# Patient Record
Sex: Female | Born: 1995 | Race: White | Hispanic: No | Marital: Single | Smoking: Never smoker
Health system: Southern US, Community
[De-identification: ages and names within clinical notes are randomized; demographics above are authoritative.]

## PROBLEM LIST (undated history)

## (undated) DIAGNOSIS — R519 Headache, unspecified: Secondary | ICD-10-CM

## (undated) DIAGNOSIS — R51 Headache: Secondary | ICD-10-CM

## (undated) DIAGNOSIS — J189 Pneumonia, unspecified organism: Secondary | ICD-10-CM

## (undated) HISTORY — PX: ANTERIOR CRUCIATE LIGAMENT REPAIR: SHX115

## (undated) HISTORY — PX: LINGUAL FRENECTOMY: SHX6357

---
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
## 2015-05-02 ENCOUNTER — Other Ambulatory Visit (HOSPITAL_COMMUNITY): Payer: Self-pay | Admitting: Orthopedic Surgery

## 2015-06-05 ENCOUNTER — Encounter (HOSPITAL_COMMUNITY): Payer: Self-pay | Admitting: *Deleted

## 2015-06-05 NOTE — Progress Notes (Signed)
Pt denies any cardiac history.

## 2015-06-06 ENCOUNTER — Encounter (HOSPITAL_COMMUNITY)
Admission: RE | Disposition: A | Discharge status: Home / Self Care | Payer: Self-pay | Source: Ambulatory Visit | Attending: Orthopedic Surgery

## 2015-06-06 ENCOUNTER — Ambulatory Visit (HOSPITAL_COMMUNITY): Payer: 59 | Admitting: Anesthesiology

## 2015-06-06 ENCOUNTER — Ambulatory Visit (HOSPITAL_COMMUNITY): Payer: 59

## 2015-06-06 ENCOUNTER — Ambulatory Visit (HOSPITAL_COMMUNITY)
Admission: RE | Admit: 2015-06-06 | Discharge: 2015-06-06 | Disposition: A | Discharge status: Home / Self Care | Payer: 59 | Source: Ambulatory Visit | Attending: Orthopedic Surgery | Admitting: Orthopedic Surgery

## 2015-06-06 ENCOUNTER — Encounter (HOSPITAL_COMMUNITY): Payer: Self-pay | Admitting: Anesthesiology

## 2015-06-06 DIAGNOSIS — Z419 Encounter for procedure for purposes other than remedying health state, unspecified: Secondary | ICD-10-CM

## 2015-06-06 DIAGNOSIS — M94261 Chondromalacia, right knee: Secondary | ICD-10-CM | POA: Insufficient documentation

## 2015-06-06 DIAGNOSIS — Z793 Long term (current) use of hormonal contraceptives: Secondary | ICD-10-CM | POA: Insufficient documentation

## 2015-06-06 DIAGNOSIS — M238X1 Other internal derangements of right knee: Secondary | ICD-10-CM | POA: Insufficient documentation

## 2015-06-06 HISTORY — DX: Headache, unspecified: R51.9

## 2015-06-06 HISTORY — PX: ANTERIOR CRUCIATE LIGAMENT REPAIR: SHX115

## 2015-06-06 HISTORY — DX: Headache: R51

## 2015-06-06 HISTORY — DX: Pneumonia, unspecified organism: J18.9

## 2015-06-06 LAB — PREGNANCY, URINE: PREG TEST UR: NEGATIVE

## 2015-06-06 SURGERY — RECONSTRUCTION, KNEE, ACL, USING HAMSTRING GRAFT
Anesthesia: Regional | Site: Knee | Laterality: Right

## 2015-06-06 MED ORDER — GLYCOPYRROLATE 0.2 MG/ML IJ SOLN
INTRAMUSCULAR | Status: DC | PRN
  Administered 2015-06-06: 0.4 mg via INTRAVENOUS

## 2015-06-06 MED ORDER — HYDROMORPHONE HCL 1 MG/ML IJ SOLN
INTRAMUSCULAR | Status: AC
  Filled 2015-06-06: qty 1

## 2015-06-06 MED ORDER — OXYCODONE-ACETAMINOPHEN 5-325 MG PO TABS: 1.0000 | ORAL_TABLET | Freq: Four times a day (QID) | ORAL | Status: AC | PRN

## 2015-06-06 MED ORDER — MIDAZOLAM HCL 5 MG/5ML IJ SOLN
INTRAMUSCULAR | Status: DC | PRN
  Administered 2015-06-06: 2 mg via INTRAVENOUS

## 2015-06-06 MED ORDER — CHLORHEXIDINE GLUCONATE 4 % EX LIQD: 60.0000 mL | Freq: Once | CUTANEOUS | Status: DC

## 2015-06-06 MED ORDER — METHOCARBAMOL 500 MG PO TABS: 500.0000 mg | ORAL_TABLET | Freq: Four times a day (QID) | ORAL | Status: AC

## 2015-06-06 MED ORDER — DEXAMETHASONE SODIUM PHOSPHATE 4 MG/ML IJ SOLN
INTRAMUSCULAR | Status: DC | PRN
  Administered 2015-06-06: 8 mg via INTRAVENOUS

## 2015-06-06 MED ORDER — METHOCARBAMOL 1000 MG/10ML IJ SOLN
500.0000 mg | Freq: Three times a day (TID) | INTRAMUSCULAR | Status: AC
  Administered 2015-06-06: 500 mg via INTRAVENOUS

## 2015-06-06 MED ORDER — FENTANYL CITRATE (PF) 250 MCG/5ML IJ SOLN
INTRAMUSCULAR | Status: AC
  Filled 2015-06-06: qty 5

## 2015-06-06 MED ORDER — LIDOCAINE HCL (CARDIAC) 20 MG/ML IV SOLN
INTRAVENOUS | Status: DC | PRN
  Administered 2015-06-06: 100 mg via INTRATRACHEAL
  Administered 2015-06-06: 40 mg via INTRAVENOUS

## 2015-06-06 MED ORDER — ASPIRIN EC 325 MG PO TBEC: 325.0000 mg | DELAYED_RELEASE_TABLET | Freq: Every day | ORAL | Status: AC

## 2015-06-06 MED ORDER — PROMETHAZINE HCL 25 MG/ML IJ SOLN
6.2500 mg | INTRAMUSCULAR | Status: DC | PRN
  Administered 2015-06-06: 6.25 mg via INTRAVENOUS

## 2015-06-06 MED ORDER — MORPHINE SULFATE (PF) 4 MG/ML IV SOLN
INTRAVENOUS | Status: DC | PRN
Start: 2015-06-06 — End: 2015-06-06
  Administered 2015-06-06: 4 mg via INTRAMUSCULAR

## 2015-06-06 MED ORDER — OXYCODONE-ACETAMINOPHEN 5-325 MG PO TABS
ORAL_TABLET | ORAL | Status: AC
  Filled 2015-06-06: qty 1

## 2015-06-06 MED ORDER — 0.9 % SODIUM CHLORIDE (POUR BTL) OPTIME
TOPICAL | Status: DC | PRN
  Administered 2015-06-06: 1000 mL

## 2015-06-06 MED ORDER — ROCURONIUM BROMIDE 100 MG/10ML IV SOLN
INTRAVENOUS | Status: DC | PRN
  Administered 2015-06-06: 50 mg via INTRAVENOUS

## 2015-06-06 MED ORDER — SODIUM CHLORIDE 0.9 % IR SOLN
Status: DC | PRN
  Administered 2015-06-06 (×2): 3000 mL

## 2015-06-06 MED ORDER — MIDAZOLAM HCL 2 MG/2ML IJ SOLN
INTRAMUSCULAR | Status: AC
  Filled 2015-06-06: qty 2

## 2015-06-06 MED ORDER — DEXAMETHASONE SODIUM PHOSPHATE 4 MG/ML IJ SOLN
INTRAMUSCULAR | Status: AC
  Filled 2015-06-06: qty 2

## 2015-06-06 MED ORDER — FENTANYL CITRATE (PF) 100 MCG/2ML IJ SOLN
100.0000 ug | Freq: Once | INTRAMUSCULAR | Status: AC
  Administered 2015-06-06: 100 ug via INTRAVENOUS

## 2015-06-06 MED ORDER — BUPIVACAINE-EPINEPHRINE (PF) 0.5% -1:200000 IJ SOLN
INTRAMUSCULAR | Status: DC | PRN
  Administered 2015-06-06: 30 mL via PERINEURAL

## 2015-06-06 MED ORDER — BUPIVACAINE HCL (PF) 0.25 % IJ SOLN
INTRAMUSCULAR | Status: AC
  Filled 2015-06-06: qty 30

## 2015-06-06 MED ORDER — FENTANYL CITRATE (PF) 100 MCG/2ML IJ SOLN
INTRAMUSCULAR | Status: DC | PRN
  Administered 2015-06-06 (×2): 50 ug via INTRAVENOUS
  Administered 2015-06-06: 150 ug via INTRAVENOUS

## 2015-06-06 MED ORDER — BUPIVACAINE HCL (PF) 0.25 % IJ SOLN
INTRAMUSCULAR | Status: DC | PRN
  Administered 2015-06-06: 10 mL

## 2015-06-06 MED ORDER — LIDOCAINE HCL (CARDIAC) 20 MG/ML IV SOLN
INTRAVENOUS | Status: AC
  Filled 2015-06-06: qty 15

## 2015-06-06 MED ORDER — PROPOFOL 10 MG/ML IV BOLUS
INTRAVENOUS | Status: DC | PRN
  Administered 2015-06-06: 140 mg via INTRAVENOUS

## 2015-06-06 MED ORDER — FENTANYL CITRATE (PF) 100 MCG/2ML IJ SOLN
INTRAMUSCULAR | Status: AC
  Administered 2015-06-06: 100 ug via INTRAVENOUS
  Filled 2015-06-06: qty 2

## 2015-06-06 MED ORDER — PROPOFOL 10 MG/ML IV BOLUS
INTRAVENOUS | Status: AC
  Filled 2015-06-06: qty 20

## 2015-06-06 MED ORDER — PROMETHAZINE HCL 25 MG/ML IJ SOLN
INTRAMUSCULAR | Status: AC
  Filled 2015-06-06: qty 1

## 2015-06-06 MED ORDER — METHOCARBAMOL 500 MG PO TABS
ORAL_TABLET | ORAL | Status: AC
  Administered 2015-06-06: 500 mg
  Filled 2015-06-06: qty 1

## 2015-06-06 MED ORDER — MIDAZOLAM HCL 2 MG/2ML IJ SOLN
INTRAMUSCULAR | Status: AC
  Administered 2015-06-06: 2 mg
  Filled 2015-06-06: qty 2

## 2015-06-06 MED ORDER — ONDANSETRON HCL 4 MG/2ML IJ SOLN
INTRAMUSCULAR | Status: DC | PRN
  Administered 2015-06-06: 4 mg via INTRAVENOUS

## 2015-06-06 MED ORDER — MORPHINE SULFATE (PF) 4 MG/ML IV SOLN
INTRAVENOUS | Status: AC
  Filled 2015-06-06: qty 1

## 2015-06-06 MED ORDER — OXYCODONE-ACETAMINOPHEN 5-325 MG PO TABS
1.0000 | ORAL_TABLET | Freq: Four times a day (QID) | ORAL | Status: AC | PRN
  Administered 2015-06-06: 1 via ORAL

## 2015-06-06 MED ORDER — LACTATED RINGERS IV SOLN
INTRAVENOUS | Status: DC
  Administered 2015-06-06 (×2): via INTRAVENOUS

## 2015-06-06 MED ORDER — CEFAZOLIN SODIUM-DEXTROSE 2-3 GM-% IV SOLR
2.0000 g | INTRAVENOUS | Status: AC
  Administered 2015-06-06: 2 g via INTRAVENOUS
  Filled 2015-06-06: qty 50

## 2015-06-06 MED ORDER — CLONIDINE HCL (ANALGESIA) 100 MCG/ML EP SOLN
EPIDURAL | Status: DC | PRN
  Administered 2015-06-06: .75 mL via INTRA_ARTICULAR

## 2015-06-06 MED ORDER — MIDAZOLAM HCL 2 MG/2ML IJ SOLN: 2.0000 mg | Freq: Once | INTRAMUSCULAR | Status: DC

## 2015-06-06 MED ORDER — HYDROMORPHONE HCL 1 MG/ML IJ SOLN
0.2500 mg | INTRAMUSCULAR | Status: DC | PRN
  Administered 2015-06-06: 0.25 mg via INTRAVENOUS
  Administered 2015-06-06: 0.5 mg via INTRAVENOUS

## 2015-06-06 MED ORDER — NEOSTIGMINE METHYLSULFATE 10 MG/10ML IV SOLN
INTRAVENOUS | Status: DC | PRN
  Administered 2015-06-06: 3 mg via INTRAVENOUS

## 2015-06-06 SURGICAL SUPPLY — 91 items
ANCHOR BUTTON TIGHTROPE ACL RT (Orthopedic Implant) ×3 IMPLANT
ANCHOR BUTTON TIGHTROPE RN 14 (Anchor) ×3 IMPLANT
BANDAGE ELASTIC 6 VELCRO ST LF (GAUZE/BANDAGES/DRESSINGS) ×3 IMPLANT
BANDAGE ESMARK 6X9 LF (GAUZE/BANDAGES/DRESSINGS) IMPLANT
BIT DRILL 1.5 (BIT) ×1
BIT DRILL 1.5MM (BIT) ×1 IMPLANT
BLADE CUDA 5.5 (BLADE) IMPLANT
BLADE CUTTER GATOR 3.5 (BLADE) ×3 IMPLANT
BLADE GREAT WHITE 4.2 (BLADE) IMPLANT
BLADE GREAT WHITE 4.2MM (BLADE)
BLADE SURG 10 STRL SS (BLADE) ×3 IMPLANT
BLADE SURG 15 STRL LF DISP TIS (BLADE) ×2 IMPLANT
BLADE SURG 15 STRL SS (BLADE) ×4
BNDG ELASTIC 6X15 VLCR STRL LF (GAUZE/BANDAGES/DRESSINGS) ×3 IMPLANT
BNDG ESMARK 6X9 LF (GAUZE/BANDAGES/DRESSINGS)
BUR OVAL 6.0 (BURR) ×3 IMPLANT
CLOSURE WOUND 1/2 X4 (GAUZE/BANDAGES/DRESSINGS) ×1
COVER MAYO STAND STRL (DRAPES) ×3 IMPLANT
COVER SURGICAL LIGHT HANDLE (MISCELLANEOUS) ×6 IMPLANT
CUFF TOURNIQUET SINGLE 34IN LL (TOURNIQUET CUFF) ×3 IMPLANT
CUFF TOURNIQUET SINGLE 44IN (TOURNIQUET CUFF) IMPLANT
DECANTER SPIKE VIAL GLASS SM (MISCELLANEOUS) ×6 IMPLANT
DRAPE ARTHROSCOPY W/POUCH 114 (DRAPES) ×3 IMPLANT
DRAPE C-ARM 42X72 X-RAY (DRAPES) ×3 IMPLANT
DRAPE INCISE IOBAN 66X45 STRL (DRAPES) ×3 IMPLANT
DRAPE U-SHAPE 47X51 STRL (DRAPES) ×3 IMPLANT
DRILL BIT 1.5MM (BIT) ×2
DRILL BIT 3.5 (BIT) ×3 IMPLANT
DRILL FLIPCUTTER II 8.0MM (INSTRUMENTS) ×1 IMPLANT
DRILL FLIPCUTTER II 8.5MM (INSTRUMENTS) ×2 IMPLANT
DRSG MEPILEX BORDER 4X4 (GAUZE/BANDAGES/DRESSINGS) ×6 IMPLANT
DRSG PAD ABDOMINAL 8X10 ST (GAUZE/BANDAGES/DRESSINGS) ×3 IMPLANT
DURAPREP 26ML APPLICATOR (WOUND CARE) ×6 IMPLANT
ELECT REM PT RETURN 9FT ADLT (ELECTROSURGICAL) ×3
ELECTRODE REM PT RTRN 9FT ADLT (ELECTROSURGICAL) ×1 IMPLANT
FLIPCUTTER II 8.0MM (INSTRUMENTS) ×3
FLIPCUTTER II 8.5MM (INSTRUMENTS) ×6
GAUZE SPONGE 4X4 12PLY STRL (GAUZE/BANDAGES/DRESSINGS) ×6 IMPLANT
GAUZE XEROFORM 1X8 LF (GAUZE/BANDAGES/DRESSINGS) ×6 IMPLANT
GEL DBM ALLOFUSE 1CC INJECT (Bone Implant) ×9 IMPLANT
GLOVE BIO SURGEON STRL SZ 6.5 (GLOVE) ×2 IMPLANT
GLOVE BIO SURGEONS STRL SZ 6.5 (GLOVE) ×1
GLOVE BIOGEL PI IND STRL 6.5 (GLOVE) ×1 IMPLANT
GLOVE BIOGEL PI IND STRL 8 (GLOVE) ×2 IMPLANT
GLOVE BIOGEL PI INDICATOR 6.5 (GLOVE) ×2
GLOVE BIOGEL PI INDICATOR 8 (GLOVE) ×4
GLOVE ORTHO TXT STRL SZ7.5 (GLOVE) ×3 IMPLANT
GLOVE SURG ORTHO 8.0 STRL STRW (GLOVE) ×3 IMPLANT
GLOVE SURG SS PI 8.0 STRL IVOR (GLOVE) ×6 IMPLANT
GOWN SPEC L3 XXLG W/TWL (GOWN DISPOSABLE) ×3 IMPLANT
GOWN STRL REUS W/ TWL LRG LVL3 (GOWN DISPOSABLE) ×3 IMPLANT
GOWN STRL REUS W/TWL 2XL LVL3 (GOWN DISPOSABLE) ×3 IMPLANT
GOWN STRL REUS W/TWL LRG LVL3 (GOWN DISPOSABLE) ×6
IMMOBILIZER KNEE 22 UNIV (SOFTGOODS) ×3 IMPLANT
K-WIRE SMTH SNGL TROCAR .045X9 (WIRE) ×3
KIT BASIN OR (CUSTOM PROCEDURE TRAY) ×3 IMPLANT
KIT BIOCARTILAGE DEL W/SYRINGE (KITS) ×6 IMPLANT
KIT ROOM TURNOVER OR (KITS) ×3 IMPLANT
KWIRE SMTH SNGL TROCAR .045X9 (WIRE) ×1 IMPLANT
MANIFOLD NEPTUNE II (INSTRUMENTS) ×3 IMPLANT
NEEDLE 18GX1X1/2 (RX/OR ONLY) (NEEDLE) ×6 IMPLANT
NS IRRIG 1000ML POUR BTL (IV SOLUTION) ×3 IMPLANT
PACK ARTHROSCOPY DSU (CUSTOM PROCEDURE TRAY) ×3 IMPLANT
PAD ARMBOARD 7.5X6 YLW CONV (MISCELLANEOUS) ×6 IMPLANT
PAD CAST 4YDX4 CTTN HI CHSV (CAST SUPPLIES) ×1 IMPLANT
PADDING CAST COTTON 4X4 STRL (CAST SUPPLIES) ×2
PADDING CAST COTTON 6X4 STRL (CAST SUPPLIES) ×3 IMPLANT
PENCIL BUTTON HOLSTER BLD 10FT (ELECTRODE) ×3 IMPLANT
PK GRAFTLINK AUTO IMPLANT SYST (Anchor) ×3 IMPLANT
SET ARTHROSCOPY TUBING (MISCELLANEOUS) ×2
SET ARTHROSCOPY TUBING LN (MISCELLANEOUS) ×1 IMPLANT
SPONGE LAP 4X18 X RAY DECT (DISPOSABLE) ×6 IMPLANT
SPONGE SCRUB IODOPHOR (GAUZE/BANDAGES/DRESSINGS) ×3 IMPLANT
STRIP CLOSURE SKIN 1/2X4 (GAUZE/BANDAGES/DRESSINGS) ×2 IMPLANT
SUCTION FRAZIER TIP 10 FR DISP (SUCTIONS) ×3 IMPLANT
SUT ETHILON 3 0 PS 1 (SUTURE) ×6 IMPLANT
SUT MNCRL AB 3-0 PS2 18 (SUTURE) ×3 IMPLANT
SUT VIC AB 0 CT1 27 (SUTURE) ×4
SUT VIC AB 0 CT1 27XBRD ANBCTR (SUTURE) ×2 IMPLANT
SUT VIC AB 2-0 CT1 27 (SUTURE) ×4
SUT VIC AB 2-0 CT1 TAPERPNT 27 (SUTURE) ×2 IMPLANT
SUT VICRYL 0 UR6 27IN ABS (SUTURE) ×3 IMPLANT
SYR 30ML LL (SYRINGE) ×3 IMPLANT
SYR BULB IRRIGATION 50ML (SYRINGE) ×3 IMPLANT
SYR TB 1ML LUER SLIP (SYRINGE) ×6 IMPLANT
SYSTEM GRAFT IMPLANT AUTOGRAFT (Anchor) ×1 IMPLANT
TOWEL OR 17X24 6PK STRL BLUE (TOWEL DISPOSABLE) ×3 IMPLANT
TOWEL OR 17X26 10 PK STRL BLUE (TOWEL DISPOSABLE) ×3 IMPLANT
UNDERPAD 30X30 INCONTINENT (UNDERPADS AND DIAPERS) ×3 IMPLANT
WAND HAND CNTRL MULTIVAC 90 (MISCELLANEOUS) ×3 IMPLANT
WRAP KNEE MAXI GEL POST OP (GAUZE/BANDAGES/DRESSINGS) ×3 IMPLANT

## 2015-06-06 NOTE — Transfer of Care (Signed)
Immediate Anesthesia Transfer of Care Note  [facility]: [name]  Procedure(s) Performed: Procedure(s): ANTERIOR CRUCIATE LIGAMENT (ACL) REVISION WITH HAMSTRING AUTOGRAFT, REMOVAL OF HARDWARE. (Right)  Patient Location: PACU  Anesthesia Type:GA combined with regional for post-op pain  Level of Consciousness: oriented, sedated and patient cooperative  Airway & Oxygen Therapy: Patient Spontanous Breathing and Patient connected to nasal cannula oxygen  Post-op Assessment: Report given to RN, Post -op Vital signs reviewed and stable and Patient moving all extremities X 4  Post vital signs: Reviewed and stable  Last Vitals:  Filed Vitals:   06/06/15 1402 06/06/15 1948  BP: 120/69 120/63  Pulse: 95 118  Temp:  36.4 C  Resp: 22 10    Complications: No apparent anesthesia complications

## 2015-06-06 NOTE — Anesthesia Postprocedure Evaluation (Signed)
Anesthesia Post Note  Patient: [name]  Procedure(s) Performed: Procedure(s) (LRB): ANTERIOR CRUCIATE LIGAMENT (ACL) REVISION WITH HAMSTRING AUTOGRAFT, REMOVAL OF HARDWARE. (Right)  Patient location during evaluation: PACU Anesthesia Type: General and Regional Level of consciousness: awake Pain management: pain level controlled Vital Signs Assessment: post-procedure vital signs reviewed and stable Respiratory status: spontaneous breathing Cardiovascular status: stable Postop Assessment: no signs of nausea or vomiting Anesthetic complications: no    Last Vitals:  Filed Vitals:   06/06/15 2045 06/06/15 2100  BP: 122/75   Pulse: 112 90  Temp:  36.6 C  Resp: 16 21    Last Pain:  Filed Vitals:   06/06/15 2136  PainSc: 5                  Joal Eakle

## 2015-06-06 NOTE — Brief Op Note (Signed)
06/06/2015  7:47 PM  PATIENT:  Terrilyn Pentecost  20 y.o. female  PRE-OPERATIVE DIAGNOSIS:  RIGHT ANTERIOR CRUCIATE LIGAMENT LAXITY  POST-OPERATIVE DIAGNOSIS:  RIGHT ANTERIOR CRUCIATE LIGAMENT LAXITY  PROCEDURE:  Procedure(s): ANTERIOR CRUCIATE LIGAMENT (ACL) REVISION WITH HAMSTRING AUTOGRAFT, REMOVAL OF HARDWARE.  SURGEON:  Surgeon(s): Cammy CopaScott Masyn Fullam, MD  ASSISTANT: Patrick Jupiterarla Bethune RNFA  ANESTHESIA:   general  EBL: 25 ml       BLOOD ADMINISTERED: none  DRAINS: none   LOCAL MEDICATIONS USED: marcaine mso4 clonidine  SPECIMEN:  No Specimen  COUNTS:  YES  TOURNIQUET:  * Missing tourniquet times found for documented tourniquets in log:  [id number] * Total Tourniquet Time Documented: Thigh (Right) - 125 minutes Total: Thigh (Right) - 125 minutes   DICTATION: .Other Dictation: Dictation Number [id number]  PLAN OF CARE: Discharge to home after PACU  PATIENT DISPOSITION:  PACU - hemodynamically stable

## 2015-06-06 NOTE — Progress Notes (Signed)
Orthopedic Tech Progress Note Patient Details:  Shelly Olson Jun 18, 1995 [id number]  Ortho Devices Ortho Device/Splint Location: footsie roll Ortho Device/Splint Interventions: Casandra DoffingOrdered   Shelly Olson 06/06/2015, 9:04 PM

## 2015-06-06 NOTE — H&P (Signed)
Shelly Olson is an 20 y.o. female.   Chief Complaint: Right knee pain and instability HPI: Shelly Olson is a 20 year old female who is slightly over a year out from right knee anterior cruciate ligament reconstruction with bone patella tendon bone autograft done elsewhere. She had a reinjury last fall in his been having symptomatic instability and pain on the medial aspect of her knee since that time. She did have partial medial meniscectomy at the time of her index procedure. Patient also has had an MRI scan since her reinjury this past fall which does show laxity and likely tearing of her graft. She is failed course of nonoperative conservative treatment including bracing and splinting and rehabilitation. She presents now for operative management of revision anterior cruciate ligament reconstruction after expiration risk benefits. 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 Pneumonia     "walking" pneumonia  . Headache     occasional migraine    Past Surgical History  Procedure Laterality Date  . Anterior cruciate ligament repair    . Lingual frenectomy      Family History  Problem Relation Age of Onset  . Hypertension Maternal Grandmother   . Diabetes Maternal Grandfather   . Heart disease Maternal Grandfather   . Hyperlipidemia Maternal Grandfather   . Heart disease Paternal Grandmother   . Hypertension Paternal Grandmother   . Hypertension Paternal Grandfather    Social History:  reports that she has never smoked. She has never used smokeless tobacco. She reports that she does not drink alcohol or use illicit drugs.  Allergies: No Known Allergies  Medications Prior to Admission  Medication Sig Dispense Refill  . ACZONE 7.5 % GEL Apply 1 application topically every evening.    Marland Kitchen ibuprofen (ADVIL,MOTRIN) 800 MG tablet Take 1 tab  every eight hours for pain 30 tablet 0  . norethindrone-ethinyl estradiol (JUNEL FE,GILDESS FE,LOESTRIN FE) 1-20 MG-MCG tablet Take 1 tablet by mouth every evening.       No results found for this or any previous visit (from the past 48 hour(s)). No results found.  Review of Systems  Constitutional: Negative.   HENT: Negative.   Eyes: Negative.   Respiratory: Negative.   Cardiovascular: Negative.   Gastrointestinal: Negative.   Genitourinary: Negative.   Musculoskeletal: Positive for joint pain.  Skin: Negative.   Neurological: Negative.   Endo/Heme/Allergies: Negative.   Psychiatric/Behavioral: Negative.     Last menstrual period 05/05/2015. Physical Exam  Constitutional: She appears well-developed.  HENT:  Head: Normocephalic.  Eyes: Pupils are equal, round, and reactive to light.  Neck: Normal range of motion.  Cardiovascular: Normal rate.   Respiratory: Effort normal.  Neurological: She is alert.  Skin: Skin is warm.  Psychiatric: She has a normal mood and affect.   examination the right knee demonstrates good range of motion well-healed surgical incision from a metallic tendon bone autograft anterior cruciate ligament reconstruction has a little bit of medial joint line tenderness there is no posterior lateral rotatory instability PCL is intact anterior cruciate ligament does have laxity with about 5 mm anterior translation. Pedal pulses palpable MCL LCL intact  Assessment/Plan Impression is right knee anterior cruciate ligament laxity following injury status post bone patella tendon bone reconstruction every year ago plan revision anterior cruciate ligament reconstruction with hardware removal plan at this time is to use saline  tendinosis autograft we may need to augment that with Shelly Olson depending on the size of the bone tunnels. Then using inside-out technique for slightly different tunnel angles. Risk and benefits discussed with the patient we will and to infection or  vessel damage potential for continued pain as well as instability all questions answered  Shelly Olson 06/06/2015, 12:30 PM

## 2015-06-06 NOTE — Anesthesia Preprocedure Evaluation (Addendum)
Anesthesia Evaluation  Patient identified by MRN, date of birth, ID band Patient awake    Reviewed: Allergy & Precautions, H&P , NPO status , Patient's Chart, lab work & pertinent test results  Airway Mallampati: I  TM Distance: >3 FB Neck ROM: Full    Dental no notable dental hx. (+) Teeth Intact, Dental Advisory Given   Pulmonary neg pulmonary ROS,    Pulmonary exam normal breath sounds clear to auscultation       Cardiovascular negative cardio ROS   Rhythm:Regular Rate:Normal     Neuro/Psych negative neurological ROS  negative psych ROS   GI/Hepatic negative GI ROS, Neg liver ROS,   Endo/Other  negative endocrine ROS  Renal/GU negative Renal ROS  negative genitourinary   Musculoskeletal   Abdominal   Peds  Hematology negative hematology ROS (+)   Anesthesia Other Findings   Reproductive/Obstetrics negative OB ROS                            Anesthesia Physical Anesthesia Plan  ASA: I  Anesthesia Plan: General and Regional   Post-op Pain Management: GA combined w/ Regional for post-op pain   Induction: Intravenous  Airway Management Planned: LMA  Additional Equipment:   Intra-op Plan:   Post-operative Plan: Extubation in OR  Informed Consent: I have reviewed the patients History and Physical, chart, labs and discussed the procedure including the risks, benefits and alternatives for the proposed anesthesia with the patient or authorized representative who has indicated his/her understanding and acceptance.   Dental advisory given  Plan Discussed with: CRNA  Anesthesia Plan Comments:         Anesthesia Quick Evaluation  

## 2015-06-06 NOTE — Anesthesia Procedure Notes (Addendum)
Anesthesia Regional Block:  Adductor canal block  Pre-Anesthetic Checklist: ,, timeout performed, Correct Patient, Correct Site, Correct Laterality, Correct Procedure, Correct Position, site marked, Risks and benefits discussed, pre-op evaluation,  At surgeon's request and post-op pain management  Laterality: Right  Prep: Maximum Sterile Barrier Precautions used and chloraprep       Needles:  Injection technique: Single-shot  Needle Type: Echogenic Stimulator Needle     Needle Length: 9cm 9 cm Needle Gauge: 21 and 21 G    Additional Needles:  Procedures: ultrasound guided (picture in chart) Adductor canal block Narrative:  Start time: 06/06/2015 1:50 PM End time: 06/06/2015 2:00 PM Injection made incrementally with aspirations every 5 mL. Anesthesiologist: Gaynelle AduFITZGERALD, WILLIAM  Additional Notes: 2% Lidocaine skin wheel.    Procedure Name: Intubation Date/Time: 06/06/2015 4:31 PM Performed by: Lovie CholOCK, Zayna Toste K Pre-anesthesia Checklist: Patient identified, Emergency Drugs available, Suction available, Patient being monitored and Timeout performed Patient Re-evaluated:Patient Re-evaluated prior to inductionOxygen Delivery Method: Circle system utilized Preoxygenation: Pre-oxygenation with 100% oxygen Intubation Type: IV induction Ventilation: Mask ventilation without difficulty Laryngoscope Size: Miller and 2 Grade View: Grade I Tube type: Oral Tube size: 7.0 mm Number of attempts: 1 Airway Equipment and Method: Stylet Placement Confirmation: ETT inserted through vocal cords under direct vision,  positive ETCO2,  CO2 detector and breath sounds checked- equal and bilateral Secured at: 22 cm Tube secured with: Tape Dental Injury: Teeth and Oropharynx as per pre-operative assessment

## 2015-06-06 NOTE — Op Note (Signed)
NAMELENDA, BARATTA            ACCOUNT NO.:  [id number]  MEDICAL RECORD NO.:  [id number]  LOCATION:  [facility]                         FACILITY:  [facility]  PHYSICIAN:  Burnard Bunting, M.D.    DATE OF BIRTH:  1996-04-20  DATE OF PROCEDURE:  06/06/2015 DATE OF DISCHARGE:  06/06/2015                              OPERATIVE REPORT   PREOPERATIVE DIAGNOSIS:  Right knee ACL laxity and possible medial meniscal tear.  POSTOPERATIVE DIAGNOSIS:  Right knee ACL laxity and possible medial meniscal tear.  PROCEDURE:  Right knee removal of deep hardware from the tibia and the femur.  Revision of ACL reconstruction using hamstring autograft, semitendinosus, 8.5 mm in the femur and 8 mm in the tibia.  Debridement of posterior horn medial meniscus.  SURGEON:  Burnard Bunting, M.D.  ASSISTANT:  Patrick Jupiter, RNFA.  INDICATIONS:  Shelly Olson is a patient with right knee laxity.  She had a re- injury to her knee which had previous ACL reconstruction.  She presents now for operative management after explanation of risks and benefits.  FINDINGS: 1. Examination under anesthesia, range of motion had about 3-degree     flexion contracture, but flexed easily to 135, had about 4-5 mm     anterior drawer with a positive pivot glide, had no posterolateral     rotatory instability.  Stable to varus and valgus stress at 0 and     30 degrees. 2. Diagnostic arthroscopy: 3. No loose bodies in medial or lateral gutter. 4. Grade 1 changes on the undersurface of the patella. 5. No corresponding changes on the trochlea. 6. Intact lateral compartment, articular cartilage, and meniscus. 7. Intact medial compartment, tibial articular cartilage, but with     some grade 1 to 2 chondromalacia just over that posterior horn of     the medial meniscus, but in general, the medial meniscus was intact     and stable.  No real other degenerative changes on the medial     femoral condyle other than that area just at the posterior  horn     which had grade 2 changes, and this total area was about the size     of a dime. 8. Intact PCL. 9. Lax and generally torn ACL.  PROCEDURE IN DETAIL:  The patient was brought to the operating room, where general anesthetic was induced.  Preop antibiotics administered. Time-out was called.  Right leg prescrubbed with alcohol and Betadine and allowed to air dry, prepped with DuraPrep solution and draped in a sterile manner.  Collier Flowers was used to cover the operative field.  Prior incision was from bone patellar tendon, bone harvesting was extended distally over the pes bursa tendon, semitendinosus was harvested with care being taken to avoid injury to the saphenous nerve.  That was prepared on the back table by Patrick Jupiter to a size of 8.5 mm on the femoral and 8 mm on the tibial end using dual EndoButton technique.  At this time, attention was directed towards the removal of the tibial screw, which was going to be required in order to facilitate placement of the appropriate tibial tunnel.  Using fluoroscopic guidance and a drill bit, the end of the screw  was identified.  It was removed.  At this time, with the screw removal having occurred, anteroinferior lateral and anteroinferior medial ports were established.  Diagnostic arthroscopy was performed.  Undersurface of the patella had mild wear, trochlea was intact, no loose bodies in medial or lateral gutters. Lateral compartment articular cartilage, the meniscus was intact.  ACL was about 90% torn, and the rest of it was stretched.  PCL was intact. Medial compartment had a little bit of fraying in the posterior horn of medial meniscus.  This was debrided with a shaver, but in general, the meniscus was intact, not extruded.  There was a small area about dime sized on the lateral aspect of the posterior medial condyle over this area which was visualized.  No loose chondral flaps were present, but there were grade 2 changes present.   ACL was debrided.  The notchplasty was performed.  Initial attempt was made to not remove the femoral screw.  Guide was placed through the medial femoral condyle and an 8.5 mm flip cutter was then drilled, it did abut the screw closer to the notch.  This required screw removal which was done with hyperflexion through the medial portal.  The screw was removed.  Essentially, there was a tunnel convergence near the notch, but divergence distally.  The screw was removed.  Soft tissue was debrided from the notch.  Flip cutter was then used and was able to get all the way out to the cortex of the femur.  Tibial tunnel was then drilled as well, but there was the aperture of the screw where the screw removal deep converged with that tunnel.  The graft was then passed and secured in the femur using EndoButton technique through a separate superolateral incision.  The button did flip.  Taken to the tibia and a good isometry was observed, taken through a range of motion.  14 mm button was required on the tibial side, and this was placed after StimuBlast placed in both the tibial and femoral tunnels.  The graft was tightened with the knee in full extension.  At this time, the knee was taken through a range of motion after graft tightening and found to be very stable.  The tourniquet was released.  Bleeding points encountered and controlled with electrocautery.  Thorough irrigation of the joint and the harvest incision was performed.  The harvest incision was closed using 0 Vicryl, 2-0 Vicryl, and 3-0 Monocryl.  Portals were closed using 2-0 Vicryl and 3-0 nylon.  Solution of Marcaine, morphine, clonidine injected into the knee.  The patient tolerated the procedure well without immediate complications.  Bulky dressing and knee immobilizer placed.     Burnard BuntingG. Scott Lenola Lockner, M.D.     GSD/MEDQ  D:  06/06/2015  T:  06/06/2015  Job:  [id number]

## 2015-06-07 ENCOUNTER — Encounter (HOSPITAL_COMMUNITY): Payer: Self-pay | Admitting: Orthopedic Surgery

## 2016-03-23 ENCOUNTER — Ambulatory Visit (INDEPENDENT_AMBULATORY_CARE_PROVIDER_SITE_OTHER): Payer: Self-pay | Admitting: Orthopedic Surgery

## 2016-03-23 DIAGNOSIS — S83511D Sprain of anterior cruciate ligament of right knee, subsequent encounter: Secondary | ICD-10-CM

## 2016-03-23 NOTE — Progress Notes (Signed)
Shelly Olson is now 10 months out revision right knee anterior cruciate ligament reconstruction  She's been doing well.  On exam she has full range of motion and no effusion and excellent graft stability excellent quad and hamstring strength.  Trainer did some measurements today as well as some testing in her right leg is equally if not somewhat stronger than  the left leg  Plan at this time is to let her progress into some roundoff type backhand spring with no back tuck types of a stunting.  She's unit tackle this in stages.  Her strength is excellent and the graft stability is excellent.  I don't want her doing the fullback flipped the because I think that can be too much stress on the on the leg but I think that she can progress to some of the roundoff type of exercises under supervision.

## 2016-07-23 IMAGING — RF DG C-ARM 61-120 MIN
1 series · 1 of 1 positions shown · non-contrast
Comparison: None.

CLINICAL DATA: ACL repair

EXAM:
RIGHT KNEE - 1-2 VIEW; DG C-ARM 61-120 MIN

[Series 1: run · 1 of 1 slices shown]
[im 1/1]
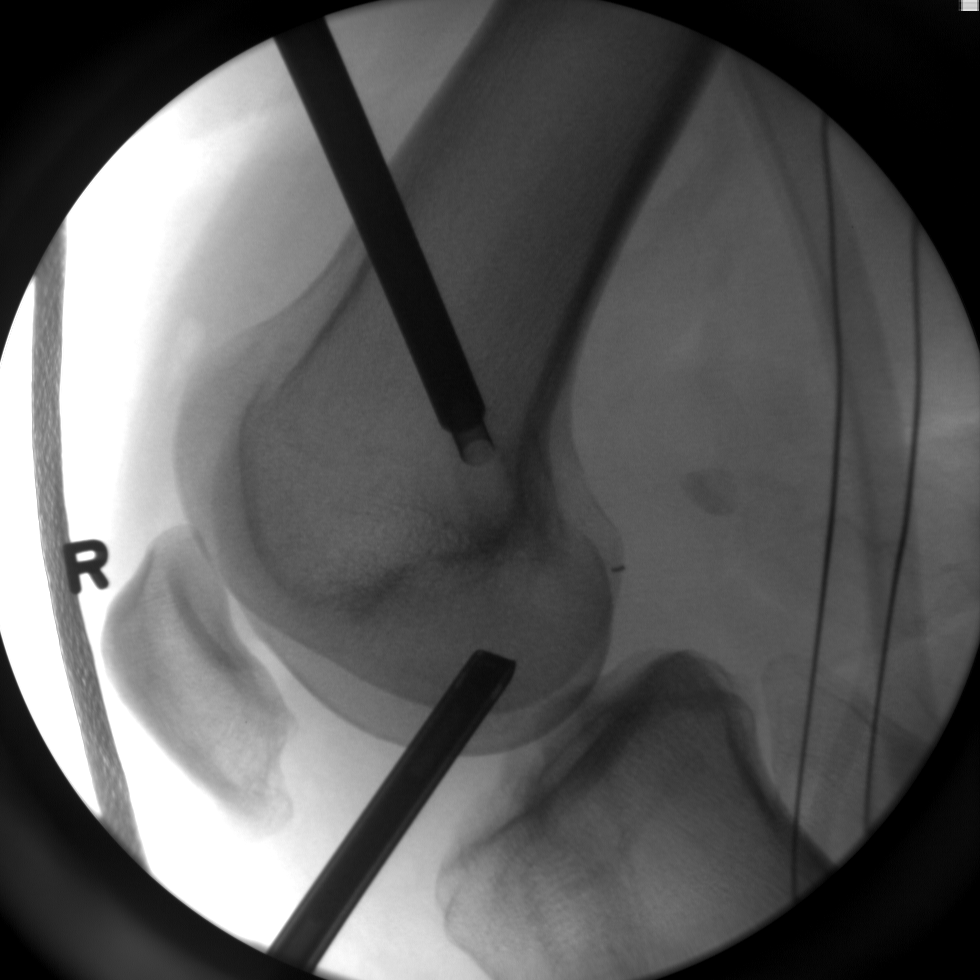

[1 of 1 positions shown; findings below may reference images not displayed]

FINDINGS: Single fluoroscopic intraoperative image of the right knee
demonstrating to metallic instruments over the distal femur.
Evidence of prior ACL repair.
IMPRESSION: Intraoperative fluoroscopic localization.

## 2017-03-29 ENCOUNTER — Other Ambulatory Visit: Payer: Self-pay | Admitting: Family Medicine

## 2017-03-29 DIAGNOSIS — R1084 Generalized abdominal pain: Secondary | ICD-10-CM

## 2017-04-02 ENCOUNTER — Other Ambulatory Visit: Payer: 59
